# Patient Record
Sex: Female | Born: 1980 | Race: White | Hispanic: No | Smoking: Former smoker
Health system: Southern US, Community
[De-identification: ages and names within clinical notes are randomized; demographics above are authoritative.]

## PROBLEM LIST (undated history)

## (undated) DIAGNOSIS — F429 Obsessive-compulsive disorder, unspecified: Secondary | ICD-10-CM

## (undated) DIAGNOSIS — F32A Depression, unspecified: Secondary | ICD-10-CM

## (undated) DIAGNOSIS — F419 Anxiety disorder, unspecified: Secondary | ICD-10-CM

## (undated) DIAGNOSIS — F329 Major depressive disorder, single episode, unspecified: Secondary | ICD-10-CM

## (undated) HISTORY — DX: Depression, unspecified: F32.A

## (undated) HISTORY — DX: Anxiety disorder, unspecified: F41.9

## (undated) HISTORY — DX: Obsessive-compulsive disorder, unspecified: F42.9

## (undated) HISTORY — DX: Major depressive disorder, single episode, unspecified: F32.9

---

## 2008-02-20 ENCOUNTER — Ambulatory Visit (HOSPITAL_BASED_OUTPATIENT_CLINIC_OR_DEPARTMENT_OTHER): Admission: RE | Admit: 2008-02-20 | Discharge: 2008-02-20 | Payer: Self-pay | Admitting: Orthopedic Surgery

## 2008-02-20 ENCOUNTER — Encounter (INDEPENDENT_AMBULATORY_CARE_PROVIDER_SITE_OTHER): Payer: Self-pay | Admitting: Orthopedic Surgery

## 2008-03-02 ENCOUNTER — Ambulatory Visit: Payer: Self-pay | Admitting: Family Medicine

## 2008-03-02 DIAGNOSIS — R030 Elevated blood-pressure reading, without diagnosis of hypertension: Secondary | ICD-10-CM

## 2008-03-02 DIAGNOSIS — K5289 Other specified noninfective gastroenteritis and colitis: Secondary | ICD-10-CM

## 2008-05-13 ENCOUNTER — Ambulatory Visit: Payer: Self-pay | Admitting: Family Medicine

## 2008-05-13 DIAGNOSIS — J019 Acute sinusitis, unspecified: Secondary | ICD-10-CM | POA: Insufficient documentation

## 2008-06-25 ENCOUNTER — Ambulatory Visit: Payer: Self-pay | Admitting: Family Medicine

## 2008-06-25 DIAGNOSIS — J069 Acute upper respiratory infection, unspecified: Secondary | ICD-10-CM | POA: Insufficient documentation

## 2008-06-30 ENCOUNTER — Ambulatory Visit: Payer: Self-pay | Admitting: Family Medicine

## 2008-06-30 DIAGNOSIS — R4589 Other symptoms and signs involving emotional state: Secondary | ICD-10-CM | POA: Insufficient documentation

## 2009-02-11 ENCOUNTER — Ambulatory Visit (HOSPITAL_BASED_OUTPATIENT_CLINIC_OR_DEPARTMENT_OTHER): Admission: RE | Admit: 2009-02-11 | Discharge: 2009-02-11 | Payer: Self-pay | Admitting: Orthopedic Surgery

## 2009-02-11 ENCOUNTER — Encounter (INDEPENDENT_AMBULATORY_CARE_PROVIDER_SITE_OTHER): Payer: Self-pay | Admitting: Orthopedic Surgery

## 2011-01-10 NOTE — Op Note (Signed)
NAMETIONNE, CARELLI            ACCOUNT NO.:  [id number]   MEDICAL RECORD NO.:  [id number]          PATIENT TYPE:  AMB   LOCATION:  DSC                          FACILITY:  [facility]   PHYSICIAN:  Cindee Salt, M.D.       DATE OF BIRTH:  November 28, 1980   DATE OF PROCEDURE:  02/11/2009  DATE OF DISCHARGE:                               OPERATIVE REPORT   PREOPERATIVE DIAGNOSIS:  Recurrent dorsal cyst, left wrist.   POSTOPERATIVE DIAGNOSIS:  Recurrent dorsal cyst, left wrist.   OPERATION:  Excision dorsal wrist ganglion with reconstruction of dorsal  capsule using extensor retinaculum, left wrist.   SURGEON:  Cindee Salt, MD   ASSISTANTCarolyne Fiscal, RN   ANESTHESIA:  IV regional.   ANESTHESIOLOGIST:  Guadalupe Maple, MD   HISTORY:  The patient is a 30 year old female with a history of a  recurrent cyst on the dorsal aspect of her left wrist.  She is desirous  of having this removed.  She is aware of risks and complications  including infection, recurrence of injury to arteries, nerves, tendons,  incomplete relief of symptoms, dystrophy.  In the preoperative area, the  patient is seen.  The extremity marked by both the patient and surgeon.  Antibiotic given.   PROCEDURE:  The patient was brought to the operating room where an IV  regional anesthetic was carried out without difficulty.  She was prepped  using ChloraPrep, supine position, left arm free.  A time-out was taken.  A 3-minute dry time allowed.  The patient was then draped.  The  transverse incision was made using the old incision, carried down  through subcutaneous tissue.  A large dorsal cyst was immediately  encountered, blunt and sharp dissection.  Isolated the neurovascular  structures.  Protecting these, retractors were placed.  The cyst was  then followed down to the scapholunate ligament complex.  This was  opened and debrided.  A moderate defect was present in the dorsal  capsule where the cyst had emerged.  The extensor  retinaculum was then  dissected free protecting nerves and the tendons.  This was then passed  beneath the tendons after irrigation.  This was sutured into position  reinforcing the dorsal capsule.  The wound was again irrigated.  The  subcutaneous tissue was then closed with interrupted 4-0 Vicryl, and the  skin with interrupted 4-0 Vicryl Rapide sutures.  A sterile compressive  dressing and splint was applied.  On deflation of the tourniquet, all  fingers immediately pinked.  Electrocautery via bipolar  was maintained for hemostasis throughout the procedure.  The patient  tolerated the procedure well and was taken to the recovery room for  observation in satisfactory condition.  She will be discharged home to  return to the [facility] of [place] in 1 week on Vicodin.           ______________________________  Cindee Salt, M.D.     GK/MEDQ  D:  02/11/2009  T:  02/12/2009  Job:  [id number]

## 2011-01-10 NOTE — Op Note (Signed)
[name], Morgan Briggs            ACCOUNT NO.:  [id number]   MEDICAL RECORD NO.:  [id number]          PATIENT TYPE:  AMB   LOCATION:  DSC                          FACILITY:  [facility]   PHYSICIAN:  Cindee Salt, M.D.       DATE OF BIRTH:  1980-10-30   DATE OF PROCEDURE:  02/20/2008  DATE OF DISCHARGE:                               OPERATIVE REPORT   PREOPERATIVE DIAGNOSIS:  Dorsal wrist ganglion, left wrist.   POSTOPERATIVE DIAGNOSIS:  Dorsal wrist ganglion, left wrist.   OPERATION:  Excision of dorsal left wrist ganglion.   SURGEON:  Cindee Salt, M.D.   ANESTHESIA:  IV regional.   ANESTHESIOLOGIST:  W. Autumn Patty, MD.   HISTORY:  The patient is a 30 year old female with a history of a large  mass over the dorsal aspect of the left wrist.  This is planned on being  excised per her request after attempting conservative treatment.  She is  aware of risks and complications including infection, recurrence, injury  to arteries, nerves, tendons, incomplete relief of symptoms, dystrophy,  and possibility of infection recurrence.  In preoperative area, the  patient is seen and the extremity marked by both the patient and surgeon  and antibiotic given.  Questions again encouraged and answered.   PROCEDURE:  The patient is brought to the operating room where a forearm-  based IV regional anesthetic was carried out without difficulty.  She  was prepped using DuraPrep in supine position with left arm free and a  time-out was taken.  The incision was made over the dorsal aspect of the  mass, third and fourth compartment of her wrist transversely, carried  down through subcutaneous tissue.  Bleeders were electrocauterized.  Neurovascular structures identified and protected.  Retractors placed.  The retinaculum was split and large polypoid cyst was immediately  encountered.  This showed a fairly thick capsule.  With blunt and sharp  dissection, this was dissected free.  Following down the  scapholunate  ligament complex, the joint was opened.  The area was debrided.  The  entire specimen was sent to pathology including the stalk.  No further  lesions were identified.  The wound was irrigated.  The capsule closed  with figure-of-eight 4-0 Vicryl sutures.  The retinaculum closed with  interrupted 4-0 Vicryl, subcutaneous tissue with 4-0 Vicryl, and the  skin with subcuticular 4-0 Vicryl Rapide suture.  A sterile compressive  dressing and splint to the wrist was applied.  The patient tolerated the  procedure well and was taken to the recovery room for observation in  satisfactory condition.  She will be discharged home to return to the  [facility] of [place] in 1 week, on Vicodin.           ______________________________  Cindee Salt, M.D.     GK/MEDQ  D:  02/20/2008  T:  02/21/2008  Job:  [id number]

## 2012-08-10 ENCOUNTER — Ambulatory Visit (HOSPITAL_COMMUNITY)
Admission: RE | Admit: 2012-08-10 | Discharge: 2012-08-10 | Disposition: A | Discharge status: Home / Self Care | Payer: BC Managed Care – PPO | Attending: Psychiatry | Admitting: Psychiatry

## 2012-08-10 ENCOUNTER — Encounter (HOSPITAL_COMMUNITY): Payer: Self-pay | Admitting: Licensed Clinical Social Worker

## 2012-08-10 DIAGNOSIS — F332 Major depressive disorder, recurrent severe without psychotic features: Secondary | ICD-10-CM | POA: Insufficient documentation

## 2012-08-10 DIAGNOSIS — F411 Generalized anxiety disorder: Secondary | ICD-10-CM | POA: Insufficient documentation
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
## 2012-08-15 ENCOUNTER — Encounter (HOSPITAL_COMMUNITY): Payer: Self-pay

## 2012-08-15 ENCOUNTER — Other Ambulatory Visit (HOSPITAL_COMMUNITY): Payer: BC Managed Care – PPO | Attending: Psychiatry | Admitting: Psychiatry

## 2012-08-15 DIAGNOSIS — F339 Major depressive disorder, recurrent, unspecified: Secondary | ICD-10-CM

## 2012-08-15 DIAGNOSIS — F332 Major depressive disorder, recurrent severe without psychotic features: Secondary | ICD-10-CM | POA: Insufficient documentation

## 2012-08-15 DIAGNOSIS — F909 Attention-deficit hyperactivity disorder, unspecified type: Secondary | ICD-10-CM | POA: Insufficient documentation

## 2012-08-15 DIAGNOSIS — F329 Major depressive disorder, single episode, unspecified: Secondary | ICD-10-CM

## 2012-08-15 DIAGNOSIS — F429 Obsessive-compulsive disorder, unspecified: Secondary | ICD-10-CM

## 2012-08-15 DIAGNOSIS — F5002 Anorexia nervosa, binge eating/purging type: Secondary | ICD-10-CM

## 2012-08-15 DIAGNOSIS — F411 Generalized anxiety disorder: Secondary | ICD-10-CM | POA: Insufficient documentation

## 2012-08-15 NOTE — Progress Notes (Signed)
    Daily Group Progress Note  Program: IOP  Group Time: 9:00-10:30 am   Participation Level: Minimal  Behavioral Response: Appropriate  Type of Therapy:  Process Group  Summary of Progress: Today was patients first day in the group. She reported "not feeling alone" after hearing some of the other members stories and said it felt good to have a place she can go to where others understand mental health and depression.     Group Time: 10:30 am - 12:00 pm   Participation Level:  Minimal  Behavioral Response: Appropriate  Type of Therapy: Psycho-education Group  Summary of Progress: Patient learned the Distress Tolerance skills of "improve the moment and "self soothe" to manage difficult emotions.  Carman Ching, LCSW

## 2012-08-16 ENCOUNTER — Other Ambulatory Visit (HOSPITAL_COMMUNITY): Payer: BC Managed Care – PPO | Admitting: Psychiatry

## 2012-08-16 DIAGNOSIS — F329 Major depressive disorder, single episode, unspecified: Secondary | ICD-10-CM

## 2012-08-16 MED ORDER — MIRTAZAPINE 15 MG PO TABS: 15.0000 mg | ORAL_TABLET | Freq: Every day | ORAL | Status: DC

## 2012-08-16 NOTE — Progress Notes (Signed)
    Daily Group Progress Note  Program: IOP  Group Time: 9:00-10:30 am   Participation Level: Minimal  Behavioral Response: Appropriate  Type of Therapy:  Process Group  Summary of Progress: Patient still reports high depression, but shared more today than previous sessions. She talked about stress and sadness associated with this Christmas due to financial stress and an inability to buy presents for her two children.    Group Time: 10:30 am - 12:00 pm   Participation Level:  Minimal  Behavioral Response: Appropriate  Type of Therapy: Psycho-education Group  Summary of Progress: Patient learned the DBT distress tolerance skill of distraction and how to use it to manage difficult emotions.  Carman Ching, LCSW
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
## 2012-08-19 ENCOUNTER — Other Ambulatory Visit (HOSPITAL_COMMUNITY): Payer: BC Managed Care – PPO | Admitting: Psychiatry

## 2012-08-19 DIAGNOSIS — F329 Major depressive disorder, single episode, unspecified: Secondary | ICD-10-CM

## 2012-08-19 MED ORDER — AMPHETAMINE-DEXTROAMPHETAMINE 20 MG PO TABS: 50.0000 mg | ORAL_TABLET | Freq: Every day | ORAL | Status: AC

## 2012-08-19 NOTE — Progress Notes (Signed)
Patient ID: Morgan Briggs, female   DOB: 1981-01-09, 31 y.o.   MRN: [id number]

## 2012-08-19 NOTE — Addendum Note (Signed)
Addended by: Margit Banda D on: 08/19/2012 11:56 AM   Modules accepted: Orders

## 2012-08-19 NOTE — Progress Notes (Signed)
    Daily Group Progress Note  Program: IOP  Group Time: 9:00-10:30 am   Participation Level: Active  Behavioral Response: Appropriate  Type of Therapy:  Process Group  Summary of Progress: Patient required prompting to share today, and expressed difficulty with talking in groups due to social anxiety. She shared her main stressors are financial and being "in debt" and unable to pay all of her credit card bills. 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 She has been talking for the past few days about her "children" and today when asked more in detail about them, she appeared uncomfortable as she described them as her partners grandchildren. She states her parents are not supportive of this relationship and believe patients decision to be in this relationship is contributing her financial stressors due to her partner not being employed and the main cause to patients depression.     Group Time: 10:30 am - 12:00 pm   Participation Level:  Active  Behavioral Response: Appropriate  Type of Therapy: Psycho-education Group  Summary of Progress: Patient participated in a grief and loss group facilitated by Theda Belfast and identified current losses and appropriate ways to grieve.   Carman Ching, LCSW

## 2012-08-20 ENCOUNTER — Other Ambulatory Visit (HOSPITAL_COMMUNITY): Payer: BC Managed Care – PPO

## 2012-08-21 ENCOUNTER — Other Ambulatory Visit (HOSPITAL_COMMUNITY): Payer: BC Managed Care – PPO

## 2012-08-22 ENCOUNTER — Other Ambulatory Visit (HOSPITAL_COMMUNITY): Payer: BC Managed Care – PPO | Admitting: Psychiatry

## 2012-08-22 DIAGNOSIS — F329 Major depressive disorder, single episode, unspecified: Secondary | ICD-10-CM

## 2012-08-22 NOTE — Progress Notes (Signed)
    Daily Group Progress Note  Program: IOP  Group Time: 9:00-10:30 am   Participation Level: Minimal  Behavioral Response: Appropriate  Type of Therapy:  Process Group  Summary of Progress: Patient only talks when called upon and appears distracted and disengaged when others are talking. She states she stopped taking the Remeron that was prescribed because she felt like her face and hands were swelling. She said she does not want any other medications. She struggles to express what she is struggling with and is practicing expressing herself in a group setting.      Group Time: 10:30 am - 12:00 pm   Participation Level:  Minimal  Behavioral Response: Appropriate  Type of Therapy: Psycho-education Group  Summary of Progress: Patient learned a stress management skill, practiced it during the group and discussed ways to use it to continue managing stress as a part of a regular wellness routine.   Carman Ching, LCSW

## 2012-08-23 ENCOUNTER — Other Ambulatory Visit (HOSPITAL_COMMUNITY): Payer: BC Managed Care – PPO | Admitting: Psychiatry

## 2012-08-23 DIAGNOSIS — F329 Major depressive disorder, single episode, unspecified: Secondary | ICD-10-CM

## 2012-08-23 MED ORDER — FLUOXETINE HCL 40 MG PO CAPS: 40.0000 mg | ORAL_CAPSULE | Freq: Every day | ORAL | Status: AC
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
## 2012-08-23 NOTE — Progress Notes (Signed)
Patient ID: Morgan Briggs, female   DOB: 09-28-1980, 31 y.o.   MRN: [id number] Pt seen states had a rash and difficulty urinating and bad dreams on Remeron so dc it and feels better. Pt has been tried on multiple antidepressants, so we discussed restarting Prozac at 40 mg po q d. Pt ok with this. No si / Hio. No hallucinations/ delusions.

## 2012-08-23 NOTE — Progress Notes (Signed)
    Daily Group Progress Note  Program: IOP  Group Time: 9:00-10:30 am   Participation Level: Active  Behavioral Response: Appropriate  Type of Therapy:  Process Group  Summary of Progress: Patient was very talkative today and open with the group for the first time. She states that she feels like she is making progress now with her depression and anxiety. She downloaded the heartmath program on her cell phone and is trying to use the coping skills she is learning in the group. She responds very well to DBT skills and tools she can use. She talked today about her OCD tendencies at her job that are interfering with her accomplishing required items. She does not know how to distract away from her need to fixate on what she calls "tiny things".      Group Time: 10:30 am - 12:00 pm   Participation Level:  Active  Behavioral Response: Appropriate  Type of Therapy: Psycho-education Group  Summary of Progress: Patient learned about the "spheres of control" and identified what items in their life they don't really have control over that they try to control and exhaust energy and produce needless stress.   Carman Ching, LCSW

## 2012-08-26 ENCOUNTER — Other Ambulatory Visit (HOSPITAL_COMMUNITY): Payer: BC Managed Care – PPO | Admitting: Psychiatry

## 2012-08-26 DIAGNOSIS — F329 Major depressive disorder, single episode, unspecified: Secondary | ICD-10-CM

## 2012-08-26 NOTE — Progress Notes (Signed)
    Daily Group Progress Note  Program: IOP  Group Time: 9:00-10:30 am   Participation Level: None  Behavioral Response: Appropriate  Type of Therapy:  Process Group  Summary of Progress: Patient sat with her head down and did not talk, despite being called upon. She did not even respond with non-verbal cues. During break she was talkative and social, but returned to this unresponsive state for the second half of group following break.      Group Time: 10:30 am - 12:00 pm   Participation Level:  None  Behavioral Response: Appropriate  Type of Therapy: Psycho-education Group  Summary of Progress:  Patient participated in a goodbye ceremony to a member ending today and practiced the skill of healthy closure.  Carman Ching, LCSW

## 2012-08-27 ENCOUNTER — Other Ambulatory Visit (HOSPITAL_COMMUNITY): Payer: BC Managed Care – PPO | Admitting: Psychiatry

## 2012-08-27 DIAGNOSIS — F329 Major depressive disorder, single episode, unspecified: Secondary | ICD-10-CM

## 2012-08-27 NOTE — Progress Notes (Signed)
    Daily Group Progress Note  Program: IOP  Group Time: 9:00-10:30 am   Participation Level: Minimal  Behavioral Response: Appropriate  Type of Therapy:  Process Group  Summary of Progress: Patient did not share and sat with her head down.      Group Time: 10:30 am - 12:00 pm   Participation Level:  Minimal  Behavioral Response: Appropriate  Type of Therapy: Psycho-education Group  Summary of Progress: Patient learned skills of self-care and how to take personal responsibility over changing behaviors to increase wellness and creating a daily routine of scheduled activities.   Carman Ching, LCSW

## 2012-08-29 ENCOUNTER — Other Ambulatory Visit (HOSPITAL_COMMUNITY): Payer: BC Managed Care – PPO | Attending: Psychiatry | Admitting: Psychiatry

## 2012-08-29 DIAGNOSIS — F411 Generalized anxiety disorder: Secondary | ICD-10-CM | POA: Insufficient documentation

## 2012-08-29 DIAGNOSIS — F909 Attention-deficit hyperactivity disorder, unspecified type: Secondary | ICD-10-CM | POA: Insufficient documentation

## 2012-08-29 DIAGNOSIS — F332 Major depressive disorder, recurrent severe without psychotic features: Secondary | ICD-10-CM | POA: Insufficient documentation

## 2012-08-29 DIAGNOSIS — F329 Major depressive disorder, single episode, unspecified: Secondary | ICD-10-CM

## 2012-08-29 DIAGNOSIS — F429 Obsessive-compulsive disorder, unspecified: Secondary | ICD-10-CM | POA: Insufficient documentation

## 2012-08-29 NOTE — Progress Notes (Signed)
    Daily Group Progress Note  Program: IOP  Group Time: 0900-1030  Participation Level: Active  Behavioral Response: Attention-Seeking  Type of Therapy:  Process Group  Summary of Progress: Patient shared, but looked to writer for approval during the whole group.  It seems as though, pt has taken on the role of co-facilitator (ie. Responding and relating to each peer and attempting to solve their issues.)       Group Time: 1045-1200  Participation Level:  Active  Behavioral Response: Attention-Seeking  Type of Therapy: Psycho-education Group  Summary of Progress: Discussed sleep hygiene practices:   What it is, five stages of sleep, tips for a better sleep, and which tip he/she will implement.  Jeri Modena, CNA

## 2012-08-30 ENCOUNTER — Telehealth (HOSPITAL_COMMUNITY): Payer: Self-pay | Admitting: Psychiatry

## 2012-08-30 ENCOUNTER — Other Ambulatory Visit (HOSPITAL_COMMUNITY): Payer: BC Managed Care – PPO

## 2012-09-02 ENCOUNTER — Other Ambulatory Visit (HOSPITAL_COMMUNITY): Payer: BC Managed Care – PPO | Admitting: Psychiatry

## 2012-09-02 DIAGNOSIS — F329 Major depressive disorder, single episode, unspecified: Secondary | ICD-10-CM

## 2012-09-02 NOTE — Progress Notes (Signed)
    Daily Group Progress Note  Program: IOP  Group Time: 9:00-10:30 am   Participation Level: Minimal  Behavioral Response: Appropriate  Type of Therapy:  Process Group  Summary of Progress: Patient did not participate unless called upon for introductions. She appeared aggitated and sat with her head down, as she has done most days during the process group. She waits for writer every day after group for individual time and appears to do better individually than in a group setting. She responds well to DBT skills.     Group Time: 10:30 am - 12:00 pm   Participation Level:  Minimal  Behavioral Response: Appropriate  Type of Therapy: Psycho-education Group  Summary of Progress: Patient participated in a group on Grief and Loss that was facilitated by Theda Belfast and identified current losses and affective ways of grieving.   Carman Ching, LCSW

## 2012-09-03 ENCOUNTER — Other Ambulatory Visit (HOSPITAL_COMMUNITY): Payer: BC Managed Care – PPO | Admitting: Psychiatry

## 2012-09-03 DIAGNOSIS — F329 Major depressive disorder, single episode, unspecified: Secondary | ICD-10-CM

## 2012-09-03 NOTE — Progress Notes (Signed)
    Daily Group Progress Note  Program: IOP  Group Time: 9:00-10:30 am   Participation Level: Minimal  Behavioral Response: Appropriate  Type of Therapy:  Process Group  Summary of Progress: Patient obtained writers e-mail address and has been sending emails to writer after group hours, below is one received yesterday. Patient is not talking much in group and tries daily to have individual time with Clinical research associate. Today she shared that she does not want to end the group and began to cry to the point that she could not continue on in the group and had to sit across the hall for fifteen minutes to collect herself.    "Hey there,   I told Gerlene Burdock you had the card I made for him. I asked him to please stop by to pick it up because I really want him to have it. He said he wouldn't be there today, but he'd stop by tomorrow. Thanks for holding onto it for me.   Something is squirming inside my head, and it is deadly uncomfortable. I've wanted to talk to you about it for awhile, but I can't seem to manage asking for more of your time, especially since you probably don't have much to spare. Although I find you to be very approachable, I can never say all the things I need to say. Within seconds my mind goes blank. My thoughts become overwhelmingly consumed by my inability to think about anything but the fact that I can't think of anything. In those moments, whatever was bothering me to begin with no longer seems relevant and is replaced by a huge knot in my stomach. I don't know why it is so hard.   Do you have some time to spare? I'll try not to freeze"     Group Time: 10:30 am - 12:00 pm   Participation Level:  Minimal  Behavioral Response: Appropriate  Type of Therapy: Psycho-education Group  Summary of Progress: Patient learned about the symptoms of depression and anxiety and how to recognize them to avoid or reduce future relapses.  Carman Ching, LCSW

## 2012-09-03 NOTE — Progress Notes (Signed)
Patient ID: Morgan Briggs, female   DOB: April 15, 1981, 32 y.o.   MRN: [id number] D:  CD-IOP group leader approached this writer stating that this patient was in her group room crying.  Met briefly with patient.  Pt voiced that she isn't ready for d/c on 09-06-11.  Pt currently denies any SI.  Provided pt with safety options, in case if she becomes suicidal.  Discussed other treatment options with patient.  Option 1:  Seeing her therapist twice a week.  According to pt, she cannot afford the copay twice a week.  Option 2:  Attending [facility] or another MH-IOP.  Pt seemed interested in attending [facility].  A:  Will provide pt with the appropriate information for the program.  Gave pt the choice of going back into group or going home for the day.  Pt chose to go back into group.  Will provide pt with DBT group information.  R:  Pt receptive.

## 2012-09-04 ENCOUNTER — Other Ambulatory Visit (HOSPITAL_COMMUNITY): Payer: BC Managed Care – PPO | Admitting: Psychiatry

## 2012-09-04 DIAGNOSIS — F329 Major depressive disorder, single episode, unspecified: Secondary | ICD-10-CM

## 2012-09-04 NOTE — Progress Notes (Signed)
    Daily Group Progress Note  Program: IOP  Group Time: 9:00-10:30 am   Participation Level: Active  Behavioral Response: Appropriate  Type of Therapy:  Process Group  Summary of Progress: Patient talked more today than in any previous group and was praised for this behavior instead of waiting to talk to writer individually after group. Patient is identifying that she engages in behaviors that push friends away from her and she wants to learn appropriate social skills. She responds well to DBT skills interventions and firm boundaries. Patient has tried to engage in a relationship with Clinical research associate outside of group, but continues to be redirected to use the group as her therapy. She is fearful of ending the group tomorrow due to "finally feeling like she fits somewhere for the first time in her life".      Group Time: 10:30 am - 12:00 pm   Participation Level:  Active  Behavioral Response: Appropriate  Type of Therapy: Psycho-education Group  Summary of Progress: Patient learned about mental health support groups for continuing support following ending the program.   Carman Ching, LCSW

## 2012-09-05 ENCOUNTER — Other Ambulatory Visit (HOSPITAL_COMMUNITY): Payer: BC Managed Care – PPO | Admitting: Psychiatry

## 2012-09-05 DIAGNOSIS — F329 Major depressive disorder, single episode, unspecified: Secondary | ICD-10-CM

## 2012-09-05 NOTE — Progress Notes (Signed)
Discharge Note  Patient:  Morgan Briggs is an 32 y.o., female DOB:  [birth year]  Date of Admission:  12/9/132  Date of Discharge:  09/05/12  Reason for Admission: Patient is 32 year old Caucasian female who was admitted to intensive outpatient program on 08/15/2012 after feeling depressed.  She reported crying spells social withdrawal decreased concentration fatigue anhedonia and feeling of hopelessness or helplessness.  She denies any suicidal thoughts or homicidal thoughts but endorse decreased motivation to do things.  Her major stressors are financial problems, she's been spending credit card to meet her demands.  She also not happy with her current job.  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  Hospital Course: The patient attended the intensive outpatient program.  She was started on Remeron however she reported side effects and it was discontinued.  She was able to verbalize her feeling.  During the program she remained anxious but overall she likes the program. She wanted to stay in the program longer however her insurance did not approve further coverage.  Patient continued on her Adderall, Klonopin and Prozac.  Patient denies any side effects of medication.  She continues to have some insomnia but denies any hallucination paranoia or active or passive suicidal thinking.  She felt her energy and concentration is improved.  Mental Status at Discharge: Patient is casually dressed and fairly groomed.  She appears anxious but cooperative.  She maintained fair eye contact.  Her speech is slow and soft.  Her thought process organized logical linear and goal-directed.  There were no flight of ideas or loose association.  She denies any active or passive suicidal thoughts or homicidal thoughts.  She denies any auditory or visual hallucination.  There were no paranoia delusions present at this time.  There were no tremors or shakes present.  She described her mood is  anxious and her affect is mood appropriate.  Her attention and concentration is okay.  She's alert and oriented x3.  Her insight judgment and impulse control is okay.  Lab Results: No results found for this or any previous visit (from the past 48 hour(s)).  Current outpatient prescriptions:amphetamine-dextroamphetamine (ADDERALL) 20 MG tablet, Take 2.5 tablets (50 mg total) by mouth daily., Disp: 75 tablet, Rfl: 0;  clonazePAM (KLONOPIN) 1 MG tablet, Take 1 mg by mouth 2 (two) times daily as needed., Disp: , Rfl: ;  FLUoxetine (PROZAC) 40 MG capsule, Take 1 capsule (40 mg total) by mouth daily., Disp: 30 capsule, Rfl: 0  Axis Diagnosis:  Axis I: ADHD, inattentive type and Major Depression, Recurrent severe   Level of Care:  OP  Discharge destination:  Home  Is patient on multiple antipsychotic therapies at discharge:  No    Has Patient had three or more failed trials of antipsychotic monotherapy by history:  No  Patient phone:  [phone] (home)  Patient address:   [address] [place] [address],   Follow-up recommendations:  Other:  Patient will followup with her therapist Kara Dies and continue to receive management and by her primary care physician Dr. Estell Harpin.  She was also recommended that her symptoms get worse then she should consider partial hospitalization program at old [facility].  Patient acknowledged the recommendation.  Comments:  Patient has no side effects of medication.  The patient received suicide prevention pamphlet:  Yes Belongings returned:  Medications  Mayelin Panos T. 09/05/2012, 9:11 AM

## 2012-09-05 NOTE — Patient Instructions (Signed)
Patient completed MH-IOP today.  Will follow up with Noni Saupe, LCSW today at 5pm and will schedule a follow up appt with Dr. Estell Harpin.  Encouraged support groups.

## 2012-09-05 NOTE — Progress Notes (Signed)
    Daily Group Progress Note  Program: IOP  Group Time: 9:00-10:30 am   Participation Level: Active  Behavioral Response: Appropriate  Type of Therapy:  Process Group  Summary of Progress: Today is patients final day in the group. She expressed great sadness and loss associated with ending and described feeling "accpeted and not alone" in the group. Patient struggles with distress tolerance and emotional regulation skills and benefited from the DBT skills taught in the sessions. She continued to Scientific laboratory technician daily after groups ended and below is the Scientific laboratory technician received yesterday after group ended. Patient reports feeling stable to end today but will need additional counseling and skills to manage depression symptoms.   Patient wrote:  "Here's the template I promised to send to you. I can't take credit for the whole thing. Some ideas came from a sheet [place] [employer] gave [place], and others were ideas I found on-line.  Feeling so distraught about tomorrow, I started writing this plan with hopes that it would relieve some of the panic that was inhibiting my sleep, and perhaps with a plan in place, I won't immediately feel like I'm falling to pieces.  Today I was thinking about a poem I wrote not that long ago. It came to mind when I thought about the answer to your question from earlier this morning. "What is one word that describes you right now?" Delicate. I am a sad porcelain figurine, Placed on the highest shelf, Ignored and unseen.  I have numerous cracks from each fall, And several chips from each drop. I've been sanded and glued back together countless times. Never the same. A figurine that's become so delicate. With one last fall, one last drop, I will shatter into a thousand little pieces. Within moments, A once porcelain figurine becoming so desolate, Transforming into perilous, tiny, sharp pieces, Carefully swept into an old dust pan.  Unsalvageable. Disposed of. Let go and  forgotten.   I'm terrified that one day I might break. I can't remember exactly when, but I grabbed your hand... Please don't ever let go."     Group Time: 10:30 am - 12:00 pm   Participation Level:  Active  Behavioral Response: Appropriate  Type of Therapy: Psycho-education Group  Summary of Progress: Patient participated in her goodbye ceremony and said goodbye to the members of the group and practiced the skill of having healthy closure.   Carman Ching, LCSW

## 2012-09-05 NOTE — Progress Notes (Signed)
Patient ID: Morgan Briggs, female   DOB: 18-Aug-1981, 32 y.o.   MRN: [id number] D:  Patient is a 32 y.o caucasian, female, in same-sex relationship (for 32 years) who presented to [facility] for scheduled assessment to begin Psych IOP at recommendation of her therapist, Noni Saupe LCSW, and her PCP, Dr. Estell Harpin. Pt reported having symptoms of depression most of her life and these symptoms have become worse over the past year. She reported crying spells, social withdrawal, decreased concentration, fatigue, staying in bed all day, anhedonia and feelings of sadness and helplessness. She has had difficulty getting out of bed and has not been able to work or do her regular activities over the past 4 days. She reported anxiety, particularly regarding unfamiliar situations or social contact, and stated she has had panic attacks 2-3 times per month.  Pt states she is feeling a "little better."  Reports continued struggle with poor sleep, anxiety, crying spells, poor concentration and energy.  Denies any SI/HI or A/V hallucination.  States that the groups were very helpful.  Wants to continue working on coping skills.  States she has created a treatment plan for herself, in which she plans to share with her therapist.  A:  D/C today.  F/U with Noni Saupe, LCSW on 09-05-12 @ 5pm and pt will schedule an appt with Dr. Estell Harpin.  Encouraged support groups.  Writer had provided pt with other MH-IOP options, DBT group info, Vocational Rehab info, and [employer].  R:  Pt receptive.

## 2018-04-02 ENCOUNTER — Other Ambulatory Visit: Payer: Self-pay | Admitting: Family Medicine

## 2018-04-02 DIAGNOSIS — Z803 Family history of malignant neoplasm of breast: Secondary | ICD-10-CM

## 2018-04-02 DIAGNOSIS — Z1231 Encounter for screening mammogram for malignant neoplasm of breast: Secondary | ICD-10-CM

## 2018-05-06 ENCOUNTER — Ambulatory Visit
Admission: RE | Admit: 2018-05-06 | Discharge: 2018-05-06 | Disposition: A | Discharge status: Home / Self Care | Payer: BC Managed Care – PPO | Source: Ambulatory Visit | Attending: Family Medicine | Admitting: Family Medicine

## 2018-05-06 DIAGNOSIS — Z1231 Encounter for screening mammogram for malignant neoplasm of breast: Secondary | ICD-10-CM

## 2018-05-06 DIAGNOSIS — Z803 Family history of malignant neoplasm of breast: Secondary | ICD-10-CM

## 2019-11-28 ENCOUNTER — Ambulatory Visit: Payer: Self-pay | Attending: Internal Medicine

## 2019-11-28 DIAGNOSIS — Z23 Encounter for immunization: Secondary | ICD-10-CM

## 2019-11-28 NOTE — Progress Notes (Signed)
   Covid-19 Vaccination Clinic  Name:  Morgan Briggs    MRN: [id number] DOB: 1981/01/06  11/28/2019  [name] was observed post Covid-19 immunization for 30 minutes based on pre-vaccination screening without incident. She was provided with Vaccine Information Sheet and instruction to access the V-Safe system.   [name] was instructed to call 911 with any severe reactions post vaccine: Marland Kitchen Difficulty breathing  . Swelling of face and throat  . A fast heartbeat  . A bad rash all over body  . Dizziness and weakness   Immunizations Administered    Name Date Dose VIS Date Route   Pfizer COVID-19 Vaccine 11/28/2019 12:58 PM 0.3 mL 08/08/2019 Intramuscular   Manufacturer: [employer], [employer]   Lot: [id number]   NDC: [address]-7

## 2019-12-22 ENCOUNTER — Ambulatory Visit: Payer: Self-pay | Attending: Internal Medicine

## 2019-12-22 DIAGNOSIS — Z23 Encounter for immunization: Secondary | ICD-10-CM

## 2019-12-22 NOTE — Progress Notes (Signed)
   Covid-19 Vaccination Clinic  Name:  Morgan Briggs    MRN: [id number] DOB: 16-Oct-1980  12/22/2019  Ms. Mickel was observed post Covid-19 immunization for 15 minutes without incident. She was provided with Vaccine Information Sheet and instruction to access the V-Safe system.   Ms. Janus was instructed to call 911 with any severe reactions post vaccine: Marland Kitchen Difficulty breathing  . Swelling of face and throat  . A fast heartbeat  . A bad rash all over body  . Dizziness and weakness   Immunizations Administered    Name Date Dose VIS Date Route   Pfizer COVID-19 Vaccine 12/22/2019  1:30 PM 0.3 mL 10/22/2018 Intramuscular   Manufacturer: [employer], [employer]   Lot: [id number]   NDC: [address]-5

## 2021-06-01 ENCOUNTER — Other Ambulatory Visit: Payer: Self-pay | Admitting: Family Medicine

## 2021-06-01 DIAGNOSIS — Z1231 Encounter for screening mammogram for malignant neoplasm of breast: Secondary | ICD-10-CM

## 2021-06-28 ENCOUNTER — Other Ambulatory Visit: Payer: Self-pay

## 2021-06-28 ENCOUNTER — Ambulatory Visit
Admission: RE | Admit: 2021-06-28 | Discharge: 2021-06-28 | Disposition: A | Discharge status: Home / Self Care | Payer: Medicare PPO | Source: Ambulatory Visit | Attending: Family Medicine | Admitting: Family Medicine

## 2021-06-28 DIAGNOSIS — Z1231 Encounter for screening mammogram for malignant neoplasm of breast: Secondary | ICD-10-CM

## 2022-05-16 ENCOUNTER — Other Ambulatory Visit: Payer: Self-pay | Admitting: Family Medicine

## 2022-05-16 DIAGNOSIS — Z1231 Encounter for screening mammogram for malignant neoplasm of breast: Secondary | ICD-10-CM

## 2022-06-30 ENCOUNTER — Ambulatory Visit: Payer: Medicare PPO

## 2022-08-29 ENCOUNTER — Ambulatory Visit
Admission: RE | Admit: 2022-08-29 | Discharge: 2022-08-29 | Disposition: A | Discharge status: Home / Self Care | Payer: Medicare PPO | Source: Ambulatory Visit | Attending: Family Medicine | Admitting: Family Medicine

## 2022-08-29 DIAGNOSIS — Z1231 Encounter for screening mammogram for malignant neoplasm of breast: Secondary | ICD-10-CM

## 2022-08-31 ENCOUNTER — Other Ambulatory Visit: Payer: Self-pay | Admitting: Family Medicine

## 2022-08-31 DIAGNOSIS — R928 Other abnormal and inconclusive findings on diagnostic imaging of breast: Secondary | ICD-10-CM

## 2022-09-05 ENCOUNTER — Other Ambulatory Visit: Payer: Medicare PPO

## 2022-09-06 ENCOUNTER — Ambulatory Visit
Admission: RE | Admit: 2022-09-06 | Discharge: 2022-09-06 | Disposition: A | Discharge status: Home / Self Care | Payer: Medicare PPO | Source: Ambulatory Visit | Attending: Family Medicine | Admitting: Family Medicine

## 2022-09-06 DIAGNOSIS — R928 Other abnormal and inconclusive findings on diagnostic imaging of breast: Secondary | ICD-10-CM

## 2022-11-24 IMAGING — MG MM DIGITAL SCREENING BILAT W/ TOMO AND CAD
8 series · 9 of 24 positions shown · non-contrast
Comparison: Previous exam(s).

CLINICAL DATA: Screening.

EXAM:
DIGITAL SCREENING BILATERAL MAMMOGRAM WITH TOMOSYNTHESIS AND CAD
TECHNIQUE: Bilateral screening digital craniocaudal and mediolateral oblique
mammograms were obtained. Bilateral screening digital breast
tomosynthesis was performed. The images were evaluated with
computer-aided detection.

[L CC synth-2D]
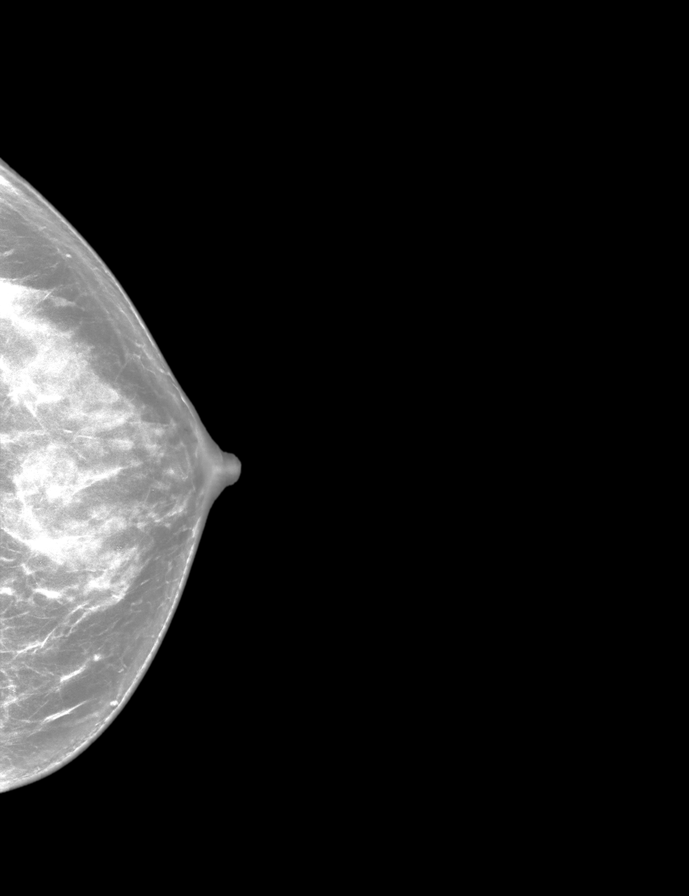

[R MLO synth-2D]
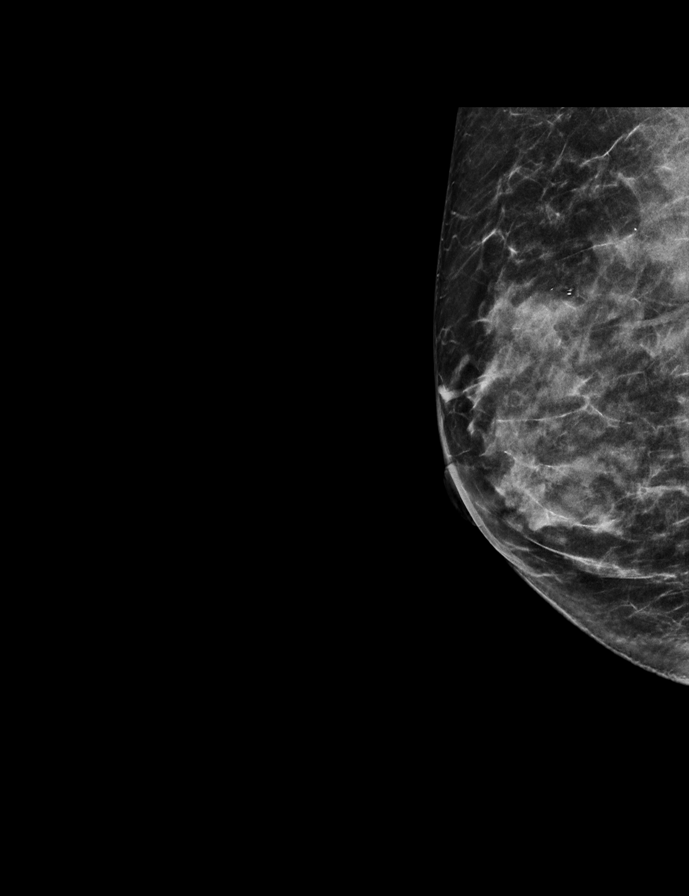

[R CC synth-2D]
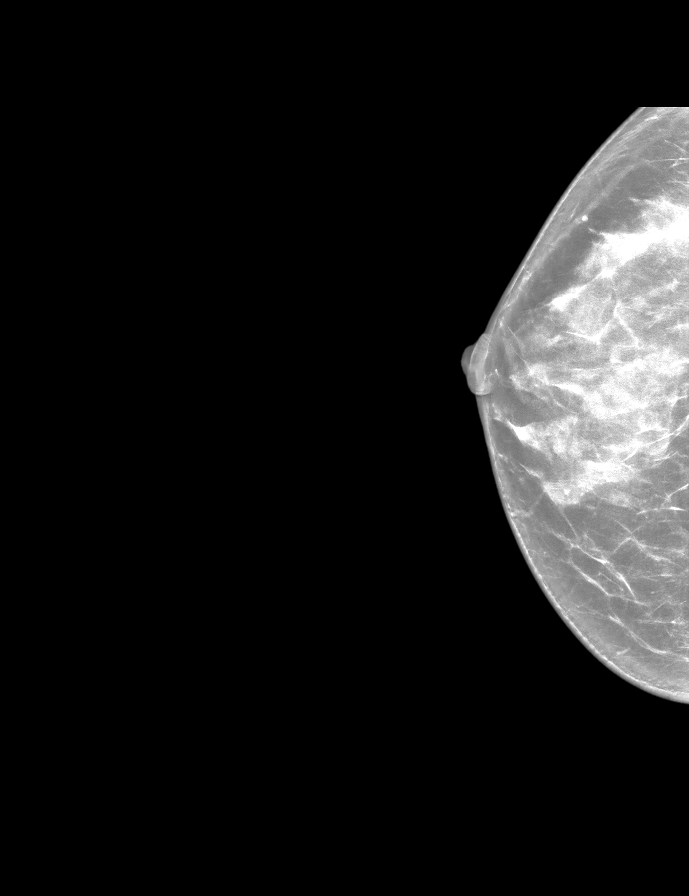

[L MLO synth-2D]
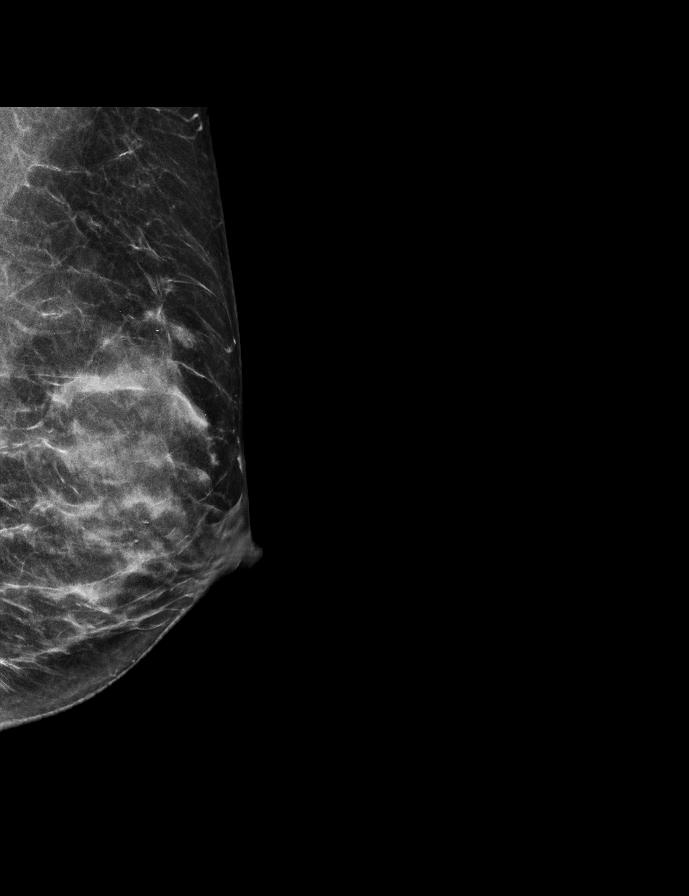

[L CC tomo · 2 of 62 frames shown]
[frame 21/62]
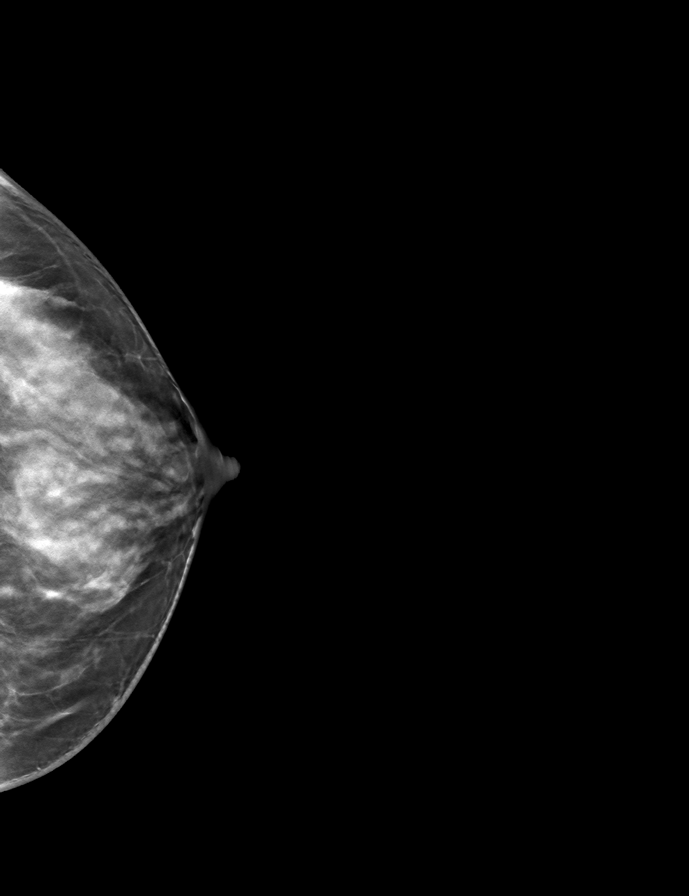
[frame 31/62]
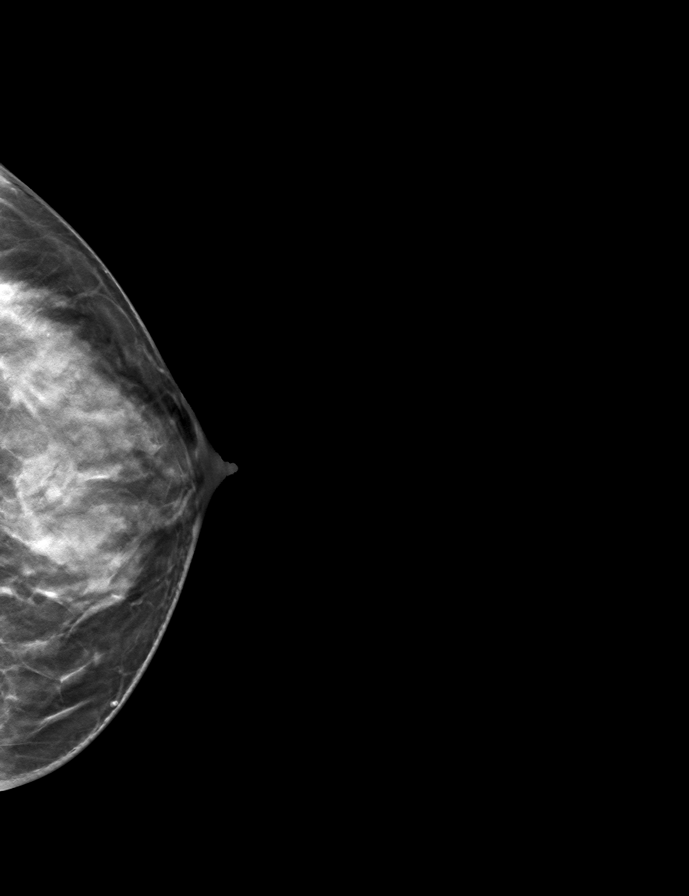

[R MLO tomo · tomo slice 29/56.0]
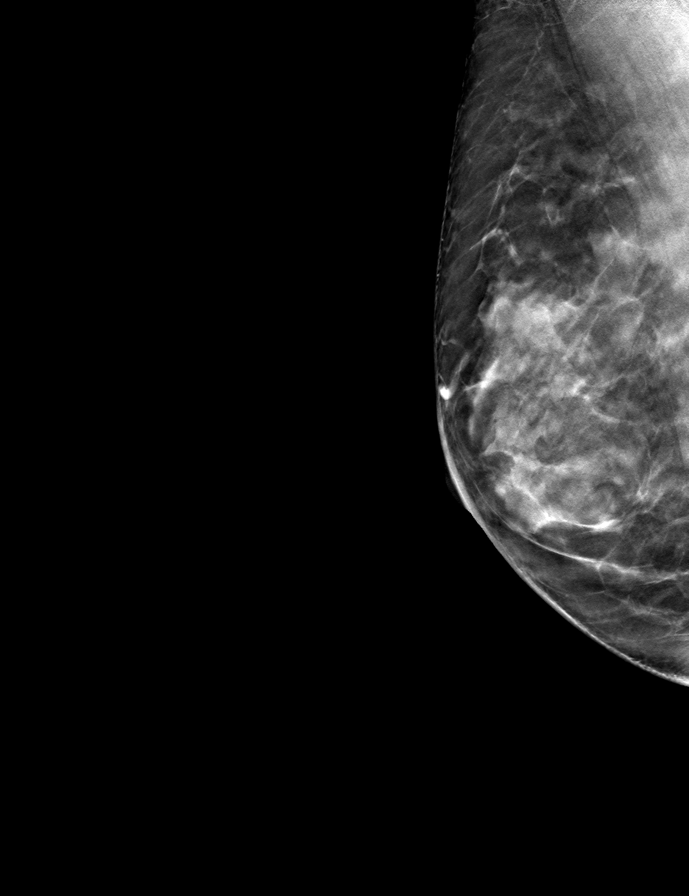

[L MLO tomo · tomo slice 29/56.0]
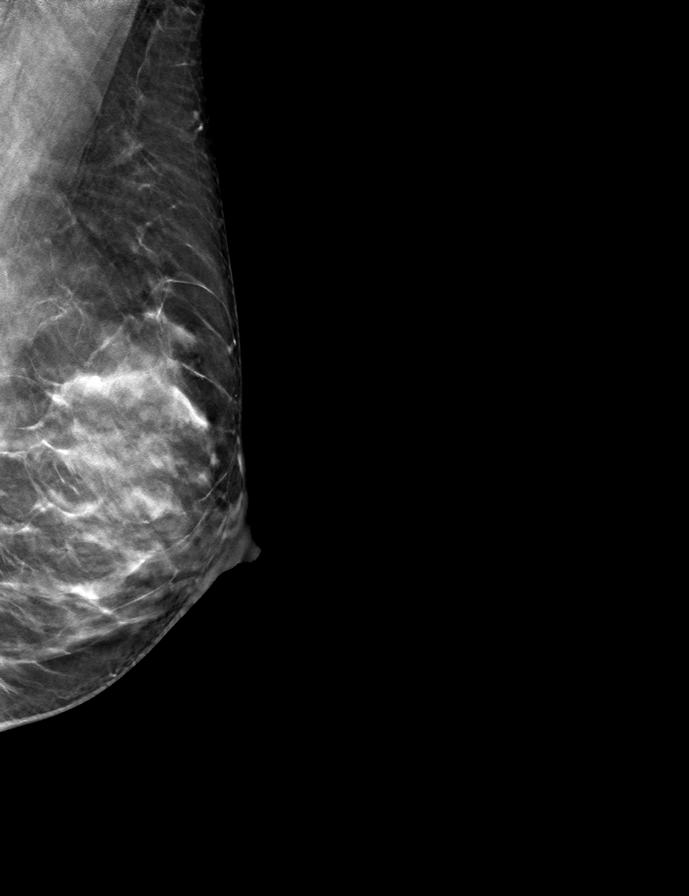

[R CC tomo · tomo slice 32/63.0]
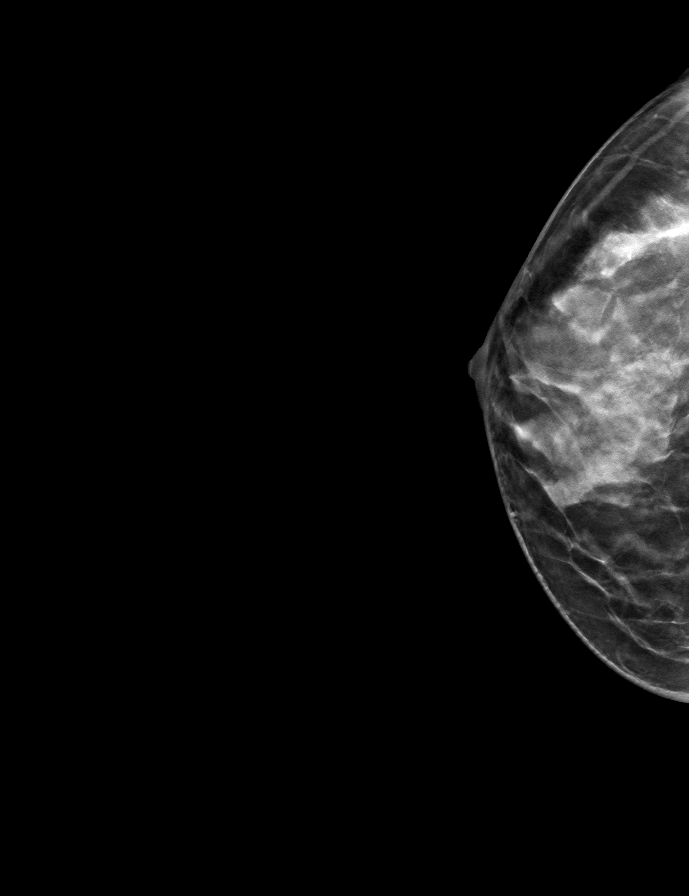

[9 of 24 positions shown; findings below may reference images not displayed]

ACR Breast Density Category d: The breast tissue is extremely dense,
which lowers the sensitivity of mammography
FINDINGS: There are no findings suspicious for malignancy.
IMPRESSION: No mammographic evidence of malignancy. A result letter of this
screening mammogram will be mailed directly to the patient.

RECOMMENDATION:
Screening mammogram in one year. (Code:[id number])

BI-RADS CATEGORY  1: Negative.

## 2023-08-07 ENCOUNTER — Other Ambulatory Visit: Payer: Self-pay | Admitting: Family Medicine

## 2023-08-07 DIAGNOSIS — Z1231 Encounter for screening mammogram for malignant neoplasm of breast: Secondary | ICD-10-CM

## 2023-09-10 ENCOUNTER — Ambulatory Visit
Admission: RE | Admit: 2023-09-10 | Discharge: 2023-09-10 | Disposition: A | Discharge status: Home / Self Care | Payer: Medicare PPO | Source: Ambulatory Visit | Attending: Family Medicine | Admitting: Family Medicine

## 2023-09-10 DIAGNOSIS — Z1231 Encounter for screening mammogram for malignant neoplasm of breast: Secondary | ICD-10-CM

## 2024-07-29 ENCOUNTER — Other Ambulatory Visit: Payer: Self-pay | Admitting: Family Medicine

## 2024-07-29 DIAGNOSIS — Z803 Family history of malignant neoplasm of breast: Secondary | ICD-10-CM

## 2024-09-10 ENCOUNTER — Ambulatory Visit
Admission: RE | Admit: 2024-09-10 | Discharge: 2024-09-10 | Disposition: A | Discharge status: Home / Self Care | Source: Ambulatory Visit | Attending: Family Medicine | Admitting: Family Medicine

## 2024-09-10 DIAGNOSIS — Z803 Family history of malignant neoplasm of breast: Secondary | ICD-10-CM
